# Patient Record
Sex: Male | Born: 2000
Health system: Southern US, Community
[De-identification: ages and names within clinical notes are randomized; demographics above are authoritative.]

## PROBLEM LIST (undated history)

## (undated) HISTORY — PX: OTHER SURGICAL HISTORY: SHX169

---
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
## 2020-02-18 ENCOUNTER — Emergency Department (HOSPITAL_COMMUNITY)
Admission: EM | Admit: 2020-02-18 | Discharge: 2020-02-18 | Disposition: A | Discharge status: Home / Self Care | Payer: 59 | Attending: Emergency Medicine | Admitting: Emergency Medicine

## 2020-02-18 ENCOUNTER — Encounter (HOSPITAL_COMMUNITY): Payer: Self-pay

## 2020-02-18 ENCOUNTER — Other Ambulatory Visit: Payer: Self-pay

## 2020-02-18 DIAGNOSIS — W260XXA Contact with knife, initial encounter: Secondary | ICD-10-CM | POA: Diagnosis not present

## 2020-02-18 DIAGNOSIS — S6992XA Unspecified injury of left wrist, hand and finger(s), initial encounter: Secondary | ICD-10-CM | POA: Diagnosis present

## 2020-02-18 DIAGNOSIS — S61211A Laceration without foreign body of left index finger without damage to nail, initial encounter: Secondary | ICD-10-CM | POA: Insufficient documentation

## 2020-02-18 MED ORDER — ACETAMINOPHEN 500 MG PO TABS
1000.0000 mg | ORAL_TABLET | Freq: Once | ORAL | Status: AC
  Administered 2020-02-18: 1000 mg via ORAL
  Filled 2020-02-18: qty 2

## 2020-02-18 MED ORDER — LIDOCAINE HCL (PF) 1 % IJ SOLN
5.0000 mL | Freq: Once | INTRAMUSCULAR | Status: AC
  Administered 2020-02-18: 5 mL
  Filled 2020-02-18: qty 30

## 2020-02-18 NOTE — Discharge Instructions (Addendum)
Sutured repair Keep the laceration site dry for the next 24 hours and leave the dressing in place. After 24 hours you may remove the dressing and gently clean the laceration site with antibacterial soap and warm water. Do not scrub the area. Do not soak the area and water for long periods of time. Don't use hydrogen peroxide, iodine-based solutions, or alcohol, which can slow healing, and will probably be painful! Apply topical bacitracin 1-2 times per day for the next 3-5 days. Return to the emergency department in 7-10 days for removal of the sutures.  You should return sooner for any signs of infection which would include increased redness around the wound, increased swelling, new drainage of yellow pus.     Please use Tylenol or ibuprofen for pain.  You may use 600 mg ibuprofen every 6 hours or 1000 mg of Tylenol every 6 hours.  You may choose to alternate between the 2.  This would be most effective.  Not to exceed 4 g of Tylenol within 24 hours.  Not to exceed 3200 mg ibuprofen 24 hours.   

## 2020-02-18 NOTE — ED Triage Notes (Signed)
Pt reports accidentally cut left index finger with knife.  Bleeding stopped.

## 2020-02-18 NOTE — ED Provider Notes (Signed)
[facility] EMERGENCY DEPARTMENT Provider Note   CSN: [id number] Arrival date & time: 02/18/20  0747     History Chief Complaint  Patient presents with  . Laceration    David Rocha is a 19 y.o. male.  HPI Patient is a 19 year old male presenting today with laceration that occurred approximately 7 hours ago daily radial aspect of his left index finger.  He states that he was cutting a zip tie with an X-[employer]o knife and slipped and cut his finger.  He states that the area is painful, he was able to control bleeding with direct pressure.  He denies any lightheadedness or dizziness denies any other injuries.  He states the pain is constant, worse with movement and touch.  No other associated symptoms.  No aggravating mitigating factors besides as discussed above.    History reviewed. No pertinent past medical history.  There are no problems to display for this patient.   Past Surgical History:  Procedure Laterality Date  . tubes in ears         No family history on file.  Social History   Tobacco Use  . Smoking status: Never Smoker  . Smokeless tobacco: Never Used  Substance Use Topics  . Alcohol use: No    Home Medications Prior to Admission medications   Medication Sig Start Date End Date Taking? Authorizing Provider  fluticasone (FLONASE) 50 MCG/ACT nasal spray Place 2 sprays into both nostrils daily for 10 days. 09/04/18 09/14/18  Benay Pike, NP  guaiFENesin-dextromethorphan (ROBITUSSIN DM) 100-10 MG/5ML syrup Take 5 mLs by mouth every 4 (four) hours as needed for cough. Patient not taking: Reported on 09/04/2018 01/21/18   Janalyn Harder, PA-C  ibuprofen (ADVIL,MOTRIN) 200 MG tablet Take 200 mg by mouth once as needed for pain.    [provider]  loratadine (CLARITIN) 10 MG tablet Take 10 mg by mouth daily. 04/06/18   [provider]  montelukast (SINGULAIR) 10 MG tablet Take 1 tablet (10 mg total) by mouth at bedtime. 09/04/18 10/04/18   Benay Pike, NP  mupirocin ointment (BACTROBAN) 2 % Place 1 application into the nose 2 (two) times daily. Patient not taking: Reported on 09/04/2018 01/21/18   Janalyn Harder, PA-C    Allergies    Patient has no known allergies.  Review of Systems   Review of Systems  Constitutional: Negative for chills and fever.  HENT: Negative for congestion.   Respiratory: Negative for shortness of breath.   Cardiovascular: Negative for chest pain.  Gastrointestinal: Negative for abdominal pain.  Musculoskeletal: Negative for neck pain.  Skin: Positive for wound.    Physical Exam Updated Vital Signs BP 132/76 (BP Location: Right Arm)   Pulse 69   Temp 98.7 F (37.1 C) (Oral)   Resp 18   Ht 5\' 8"  (1.727 m)   Wt 72.6 kg   SpO2 100%   BMI 24.33 kg/m   Physical Exam Vitals and nursing note reviewed.  Constitutional:      General: He is not in acute distress.    Appearance: Normal appearance. He is not ill-appearing.  HENT:     Head: Normocephalic and atraumatic.  Eyes:     General: No scleral icterus.       Right eye: No discharge.        Left eye: No discharge.     Conjunctiva/sclera: Conjunctivae normal.  Pulmonary:     Effort: Pulmonary effort is normal.     Breath sounds: No stridor.  Musculoskeletal:     Comments: Patient is able to flex and extend all joints of the left index finger.  Sensation is intact and good cap refill.  Skin:    Capillary Refill: Capillary refill takes less than 2 seconds.     Comments: Left index finger with 2 cm laceration on the radial aspect of the left index finger overlying the PIP joint that is gaping but does not involve the joint.  Neurological:     Mental Status: He is alert and oriented to person, place, and time. Mental status is at baseline.     ED Results / Procedures / Treatments   Labs (all labs ordered are listed, but only abnormal results are displayed) Labs Reviewed - No data to  display  EKG None  Radiology No results found.  Procedures .Marland KitchenLaceration Repair  Date/Time: 02/18/2020 12:08 PM Performed by: Gailen Shelter, PA Authorized by: Gailen Shelter, PA   Consent:    Consent obtained:  Verbal   Consent given by:  Patient   Risks discussed:  Infection, need for additional repair, pain, poor cosmetic result and poor wound healing   Alternatives discussed:  No treatment and delayed treatment Universal protocol:    Procedure explained and questions answered to patient or proxy's satisfaction: yes     Relevant documents present and verified: yes     Test results available: yes     Imaging studies available: yes     Required blood products, implants, devices, and special equipment available: yes     Site/side marked: yes     Immediately prior to procedure, a time out was called: yes     Patient identity confirmed:  Verbally with patient Anesthesia:    Anesthesia method:  Local infiltration Laceration details:    Location:  Finger   Finger location:  L index finger   Length (cm):  2 Exploration:    Limited defect created (wound extended): no     Hemostasis achieved with:  Direct pressure   Wound exploration: wound explored through full range of motion     Wound extent: areolar tissue violated     Wound extent: no foreign bodies/material noted and no tendon damage noted   Treatment:    Area cleansed with:  Saline   Amount of cleaning:  Extensive   Irrigation solution:  Tap water   Irrigation method:  Tap   Visualized foreign bodies/material removed: no     Debridement:  None Skin repair:    Repair method:  Sutures   Suture size:  4-0   Suture material:  Prolene   Suture technique:  Simple interrupted and horizontal mattress   Number of sutures:  2 Approximation:    Approximation:  Close Repair type:    Repair type:  Intermediate Post-procedure details:    Dressing:  Antibiotic ointment   Procedure completion:  Tolerated well, no  immediate complications   (including critical care time)  Medications Ordered in ED Medications  lidocaine (PF) (XYLOCAINE) 1 % injection 5 mL (5 mLs Infiltration Given 02/18/20 1130)  acetaminophen (TYLENOL) tablet 1,000 mg (1,000 mg Oral Given 02/18/20 1130)    ED Course  I have reviewed the triage vital signs and the nursing notes.  Pertinent labs & imaging results that were available during my care of the patient were reviewed by me and considered in my medical decision making (see chart for details).    MDM Rules/Calculators/A&P  Pressure irrigation performed. Wound explored and base of wound visualized in a bloodless field without evidence of foreign body.  Laceration occurred < 8 hours prior to repair which was well tolerated. Tdap is up-to-date.  Pt has no comorbidities to effect normal wound healing. Pt discharged without antibiotics.  Discussed suture home care with patient and answered questions. Pt to follow-up for wound check and suture removal in 7-10 days; they are to return to the ED sooner for signs of infection. Pt is hemodynamically stable with no complaints prior to dc.   1 horizontal and 1 simple interrupted stitch Good hemostasis distally neurovascularly intact after repair Return precautions given.  Final Clinical Impression(s) / ED Diagnoses Final diagnoses:  Laceration of left index finger without foreign body without damage to nail, initial encounter    Rx / DC Orders ED Discharge Orders    None       Gailen Shelter, [place] 02/18/20 1211    Vanetta Mulders, MD 02/19/20 [phone]

## 2021-02-10 ENCOUNTER — Ambulatory Visit: Admitting: Orthopedic Surgery

## 2021-02-15 ENCOUNTER — Encounter: Payer: Self-pay | Admitting: Orthopedic Surgery

## 2021-02-15 ENCOUNTER — Other Ambulatory Visit: Payer: Self-pay

## 2021-02-15 ENCOUNTER — Ambulatory Visit (INDEPENDENT_AMBULATORY_CARE_PROVIDER_SITE_OTHER): Admitting: Orthopedic Surgery

## 2021-02-15 VITALS — BP 122/69 | HR 89 | Ht 70.0 in | Wt 151.0 lb

## 2021-02-15 DIAGNOSIS — M25531 Pain in right wrist: Secondary | ICD-10-CM | POA: Diagnosis not present

## 2021-02-15 DIAGNOSIS — M25539 Pain in unspecified wrist: Secondary | ICD-10-CM

## 2021-02-15 NOTE — Progress Notes (Signed)
New Patient Visit  Assessment: David Rocha is a 20 y.o. male with the following: 1. Pain of ulnar side of wrist  Plan: The patient has pain in the ulnar side of his right wrist.  He notices pain when he is lifting certain objects.  It worsens with heavier items.  No tenderness within the TFCC.  No pain with ulnar deviation of the hand.  Pain is more in line with the ECU tendon.  We discussed multiple treatment options including medications, therapy, topical medications and a referral to a hand specialist.  He would like to see hand specialist to discuss this issues further.  Follow-up as needed.   Follow-up: Return if symptoms worsen or fail to improve.  Subjective:  Chief Complaint  Patient presents with   Wrist Pain    Bilateral R>L Pt right hand dominant    History of Present Illness: David Rocha is a 20 y.o. RHD male who has been referred to clinic today by Reatha Harps, DC for evaluation of right wrist pain.  Patient has had pain in the right wrist for a couple of years.  No specific injury.  He notes pain when lifting heavy objects.  Pain radiates up the ulnar side of the wrist, especially on the dorsum of the wrist.  He has had a similar type pain in the left wrist, but this spontaneously resolved.  He does not take medications on a consistent basis.  He has not worked with a Paramedic.  He has seen a Land, who did some adjustments to his wrist.   Review of Systems: No fevers or chills No numbness or tingling No chest pain No shortness of breath No bowel or bladder dysfunction No GI distress No headaches   Medical History:  No past medical history on file.  Past Surgical History:  Procedure Laterality Date   tubes in ears      No family history on file. Social History   Tobacco Use   Smoking status: Never   Smokeless tobacco: Never  Substance Use Topics   Alcohol use: No    No Known Allergies  Current Meds  Medication Sig   ibuprofen  (ADVIL,MOTRIN) 200 MG tablet Take 200 mg by mouth once as needed for pain.    Objective: BP 122/69    Pulse 89    Ht 5\' 10"  (1.778 m)    Wt 151 lb (68.5 kg)    BMI 21.67 kg/m   Physical Exam:  General: Alert and oriented. and No acute distress. Gait: Normal gait.  Evaluation of the right wrist demonstrates no deformity.  No swelling is appreciated.  No bruising is appreciated.  There is no point tenderness.  No pain with palpation of the TFCC directly.  No pain with ulnar deviation.  No obvious instability of the DRUJ.  No evidence of dislocating tendons on the dorsum of his wrist.  Fingers are warm and well-perfused.  Full and painless range of motion of all fingers.  Sensation is intact throughout the right hand.  IMAGING: I personally reviewed images previously obtained in clinic  Hardcopy x-rays were brought to clinic today by the patient.  There is no acute abnormalities.  Normal-appearing right wrist x-rays.  New Medications:  No orders of the defined types were placed in this encounter.     , MD  02/15/2021 11:18 PM

## 2021-02-23 ENCOUNTER — Encounter: Payer: Self-pay | Admitting: Orthopedic Surgery

## 2021-02-23 ENCOUNTER — Ambulatory Visit (INDEPENDENT_AMBULATORY_CARE_PROVIDER_SITE_OTHER)

## 2021-02-23 ENCOUNTER — Ambulatory Visit (INDEPENDENT_AMBULATORY_CARE_PROVIDER_SITE_OTHER): Admitting: Orthopedic Surgery

## 2021-02-23 ENCOUNTER — Ambulatory Visit: Payer: Self-pay

## 2021-02-23 ENCOUNTER — Other Ambulatory Visit: Payer: Self-pay

## 2021-02-23 DIAGNOSIS — M25531 Pain in right wrist: Secondary | ICD-10-CM

## 2021-02-23 DIAGNOSIS — M25532 Pain in left wrist: Secondary | ICD-10-CM | POA: Diagnosis not present
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
## 2021-03-02 ENCOUNTER — Telehealth: Payer: Self-pay | Admitting: Orthopedic Surgery

## 2021-03-02 NOTE — Telephone Encounter (Signed)
Pt requesting a call back. Pt states he was unable to pick his meds up from the pharmacy. Please send the script back to the pharmacy on file. Pt phone number is [phone].

## 2021-03-09 ENCOUNTER — Other Ambulatory Visit: Payer: Self-pay | Admitting: Orthopedic Surgery

## 2021-03-09 MED ORDER — IBUPROFEN 200 MG PO TABS: 400.0000 mg | ORAL_TABLET | Freq: Four times a day (QID) | ORAL | 0 refills | Status: AC | PRN

## 2021-03-09 NOTE — Telephone Encounter (Signed)
Patient called he would like ibuprofen (ADVIL,MOTRIN) 200 MG tablet  sent in for him. His call back number is [phone]

## 2021-03-24 ENCOUNTER — Ambulatory Visit: Admitting: Orthopedic Surgery

## 2021-03-30 ENCOUNTER — Ambulatory Visit (INDEPENDENT_AMBULATORY_CARE_PROVIDER_SITE_OTHER): Admitting: Orthopedic Surgery

## 2021-03-30 ENCOUNTER — Other Ambulatory Visit: Payer: Self-pay

## 2021-03-30 ENCOUNTER — Encounter: Payer: Self-pay | Admitting: Orthopedic Surgery

## 2021-03-30 DIAGNOSIS — M25539 Pain in unspecified wrist: Secondary | ICD-10-CM | POA: Diagnosis not present

## 2021-03-30 MED ORDER — MELOXICAM 7.5 MG PO TABS: 7.5000 mg | ORAL_TABLET | Freq: Every day | ORAL | 0 refills | Status: DC
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
## 2021-04-17 ENCOUNTER — Telehealth: Payer: Self-pay | Admitting: Orthopedic Surgery

## 2021-04-17 NOTE — Telephone Encounter (Signed)
Patient called. Asking for paper work that Dr. Tempie Donning has. His call back number is [phone]

## 2021-04-17 NOTE — Telephone Encounter (Signed)
Do you have this paperwork?

## 2021-04-18 NOTE — Telephone Encounter (Signed)
Contacted patient and made him aware that military paperwork is completed and he is able to come to the office to pick this up when he is able to do so.

## 2021-04-19 ENCOUNTER — Telehealth: Payer: Self-pay | Admitting: Orthopedic Surgery

## 2021-04-19 NOTE — Telephone Encounter (Signed)
Patient called to see if the forms was ready for pick up. He came by yesterday and the forms was not completed all the way so he left them here and was told they would be ready today. His call back number is [phone]

## 2021-04-19 NOTE — Telephone Encounter (Signed)
Contacted patient and informed him that paperwork had been completed on back side as requested. At this time we are waiting from signature from Dr. Frazier Butt. Informed patient to come into the office on 04/20/2021 at 8:00 AM to pick this up

## 2021-04-23 ENCOUNTER — Other Ambulatory Visit: Payer: Self-pay | Admitting: Orthopedic Surgery

## 2021-05-11 ENCOUNTER — Ambulatory Visit (INDEPENDENT_AMBULATORY_CARE_PROVIDER_SITE_OTHER): Admitting: Orthopedic Surgery

## 2021-05-11 ENCOUNTER — Other Ambulatory Visit: Payer: Self-pay

## 2021-05-11 ENCOUNTER — Encounter: Payer: Self-pay | Admitting: Orthopedic Surgery

## 2021-05-11 DIAGNOSIS — M25539 Pain in unspecified wrist: Secondary | ICD-10-CM

## 2021-05-11 DIAGNOSIS — M25531 Pain in right wrist: Secondary | ICD-10-CM

## 2021-05-11 NOTE — Progress Notes (Signed)
? ?  Office Visit Note ?  ?Patient: David Rocha           ?Date of Birth: 29-Mar-2000           ?MRN: [id number] ?Visit Date: 05/11/2021 ?             ?Requested by: No referring provider defined for this encounter. ?PCP: Patient, No Pcp Per (Inactive) ? ? ?Assessment & Plan: ?Visit Diagnoses:  ?1. Pain of ulnar side of wrist   ? ? ?Plan: Patient's ulnar sided wrist pain is largely unchanged from previous despite oral NSAIDs use, activity modification, and immobilization.  His exam is a little different today with a negative ECU synergy test.  He still has no DRUJ instability or pain w/ ulnocarpal loading suggestive of a TFCC tear.  Because he has failed conservative management so far, will proceed with an MRI of the wrist to further evaluate.  ? ?Follow-Up Instructions: No follow-ups on file.  ? ?Orders:  ?No orders of the defined types were placed in this encounter. ? ?No orders of the defined types were placed in this encounter. ? ? ? ? Procedures: ?No procedures performed ? ? ?Clinical Data: ?No additional findings. ? ? ?Subjective: ?Chief Complaint  ?Patient presents with  ? Right Wrist - Follow-up  ? ? ?This is a 21 year old right-hand-dominant male who presents with continued right ulnar-sided wrist pain.  He has been wearing a removable wrist brace and has been taking oral meloxicam.  He still has ulnar sided wrist discomfort at work when lifting any sized object and with rotational activities.  ? ? ?Review of Systems ? ? ?Objective: ?Vital Signs: There were no vitals taken for this visit. ? ?Physical Exam ? ?Right Hand Exam  ? ?Tenderness  ?Right hand tenderness location: No foveal tenderness.  No TTP along ECU tendon. ? ?Range of Motion  ?The patient has normal right wrist ROM.  ? ?Other  ?Erythema: absent ?Sensation: normal ?Pulse: present ? ?Comments:  No pain w/ ulnocarpal loading.  No DRUJ instability.  Negative ECU synergy test.  No ECU instability.  ? ? ? ? ?Specialty Comments:  ?No specialty comments  available. ? ?Imaging: ?No results found. ? ? ?PMFS History: ?Patient Active Problem List  ? Diagnosis Date Noted  ? Pain of ulnar side of wrist 03/30/2021  ? ?History reviewed. No pertinent past medical history.  ?History reviewed. No pertinent family history.  ?Past Surgical History:  ?Procedure Laterality Date  ? tubes in ears    ? ?Social History  ? ?Occupational History  ? Not on file  ?Tobacco Use  ? Smoking status: Never  ? Smokeless tobacco: Never  ?Substance and Sexual Activity  ? Alcohol use: No  ? Drug use: Not on file  ? Sexual activity: Not on file  ? ? ? ? ? ? ?

## 2021-05-12 ENCOUNTER — Telehealth: Payer: Self-pay

## 2021-05-12 NOTE — Telephone Encounter (Signed)
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 ?

## 2021-05-24 ENCOUNTER — Telehealth: Payer: Self-pay | Admitting: Orthopedic Surgery

## 2021-05-24 ENCOUNTER — Ambulatory Visit
Admission: RE | Admit: 2021-05-24 | Discharge: 2021-05-24 | Disposition: A | Discharge status: Home / Self Care | Source: Ambulatory Visit | Attending: Orthopedic Surgery | Admitting: Orthopedic Surgery

## 2021-05-24 DIAGNOSIS — M25539 Pain in unspecified wrist: Secondary | ICD-10-CM

## 2021-05-24 NOTE — Telephone Encounter (Signed)
Dr. Frazier Butt has it to complete--- ?

## 2021-05-24 NOTE — Telephone Encounter (Signed)
Patient calling to check on a military form he sent in a week or so ago to be filled out. Please call [phone] ?

## 2021-05-25 NOTE — Telephone Encounter (Signed)
Patient has been contacted and informed that forms have been filled out and is ready for pick up. ?

## 2021-05-30 ENCOUNTER — Encounter: Payer: Self-pay | Admitting: Orthopedic Surgery

## 2021-05-30 ENCOUNTER — Ambulatory Visit (INDEPENDENT_AMBULATORY_CARE_PROVIDER_SITE_OTHER): Payer: Self-pay | Admitting: Orthopedic Surgery

## 2021-05-30 ENCOUNTER — Other Ambulatory Visit: Payer: Self-pay

## 2021-05-30 DIAGNOSIS — M25539 Pain in unspecified wrist: Secondary | ICD-10-CM

## 2021-05-30 DIAGNOSIS — M25531 Pain in right wrist: Secondary | ICD-10-CM

## 2021-05-30 NOTE — Progress Notes (Signed)
? ?Office Visit Note ?  ?Patient: David Rocha           ?Date of Birth: 11-04-2000           ?MRN: [id number] ?Visit Date: 05/30/2021 ?             ?Requested by: No referring provider defined for this encounter. ?PCP: Patient, No Pcp Per (Inactive) ? ? ?Assessment & Plan: ?Visit Diagnoses:  ?1. Pain of ulnar side of wrist   ? ? ?Plan: We reviewed the results of his MRI which were essentially normal.  He has small DRUJ effusion but his TFCC and radioulnar ligaments were intact.  He had no fluid around the ECU tendon suggestive of ECU tendinitis.  His exam today is again unremarkable with no DRUJ instability, no pain or instability with ECU testing, and no evidence of TFCC injury.  We discussed trying hand therapy to work on global hand and wrist strengthening and conditioning versus potentially getting a second opinion with another [employer]or in town.  He would like to get a second opinion first before he tries therapy. ? ?Follow-Up Instructions: No follow-ups on file.  ? ?Orders:  ?No orders of the defined types were placed in this encounter. ? ?No orders of the defined types were placed in this encounter. ? ? ? ? Procedures: ?No procedures performed ? ? ?Clinical Data: ?No additional findings. ? ? ?Subjective: ?Chief Complaint  ?Patient presents with  ? Right Wrist - Follow-up  ? ? ?This is a 21 year old right-hand-dominant male who presents for follow-up of right ulnar-sided wrist pain.  He was last seen a few weeks ago at which point the plan was to get an MRI to further evaluate his wrist.  His MRI was largely unremarkable with no structural injuries noted.  He has tried a removable wrist brace and oral anti-inflammatory medication.  His pain is again worse with certain activities that involve weightbearing or hyperextension of the wrist such as a push-up.  He still describes difficulty at work with lifting and rotational activities.  ? ? ?Review of Systems ? ? ?Objective: ?Vital Signs: There were no vitals  taken for this visit. ? ?Physical Exam ? ?Right Hand Exam  ? ?Tenderness  ?The patient is experiencing no tenderness.  ? ?Range of Motion  ?Wrist  ?Right wrist extension: 65.  ?Right wrist flexion: 50.  ? ?Muscle Strength  ?The patient has normal right wrist strength. ? ?Other  ?Erythema: absent ?Sensation: normal ?Pulse: present ? ?Comments:  No TTP at fovea and no pain w/ ulnocarpal loading.  No DRUJ instability.  No pain w/ compression at the DRUJ.  Negative ECU synergy test and no ECU instability with ROM.  Full and painless active WE and WF. Full and painless pronation/supination.  ? ? ? ? ?Specialty Comments:  ?No specialty comments available. ? ?Imaging: ?No results found. ? ? ?PMFS History: ?Patient Active Problem List  ? Diagnosis Date Noted  ? Pain of ulnar side of wrist 03/30/2021  ? ?History reviewed. No pertinent past medical history.  ?History reviewed. No pertinent family history.  ?Past Surgical History:  ?Procedure Laterality Date  ? tubes in ears    ? ?Social History  ? ?Occupational History  ? Not on file  ?Tobacco Use  ? Smoking status: Never  ? Smokeless tobacco: Never  ?Substance and Sexual Activity  ? Alcohol use: No  ? Drug use: Not on file  ? Sexual activity: Not on file  ? ? ? ? ? ? ?

## 2021-06-19 ENCOUNTER — Telehealth: Payer: Self-pay | Admitting: Orthopedic Surgery

## 2021-06-19 NOTE — Telephone Encounter (Signed)
Pt is requesting copy of right wrist MRI  on a disk  ?

## 2021-06-20 NOTE — Telephone Encounter (Signed)
I called and advised patient that he would have to get the MRI from the facility that it was done, that we can only see it. He states that he understands and will call them ?

## 2022-05-03 ENCOUNTER — Encounter: Payer: Self-pay | Admitting: Radiology

## 2023-04-26 ENCOUNTER — Ambulatory Visit
Admission: EM | Admit: 2023-04-26 | Discharge: 2023-04-26 | Disposition: A | Discharge status: Home / Self Care | Payer: Self-pay | Attending: Family Medicine | Admitting: Family Medicine

## 2023-04-26 ENCOUNTER — Encounter: Payer: Self-pay | Admitting: Emergency Medicine

## 2023-04-26 DIAGNOSIS — J101 Influenza due to other identified influenza virus with other respiratory manifestations: Secondary | ICD-10-CM

## 2023-04-26 LAB — POC COVID19/FLU A&B COMBO
Covid Antigen, POC: NEGATIVE
Influenza A Antigen, POC: POSITIVE — AB
Influenza B Antigen, POC: NEGATIVE

## 2023-04-26 MED ORDER — OSELTAMIVIR PHOSPHATE 75 MG PO CAPS: 75.0000 mg | ORAL_CAPSULE | Freq: Two times a day (BID) | ORAL | 0 refills | Status: AC

## 2023-04-26 MED ORDER — PROMETHAZINE-DM 6.25-15 MG/5ML PO SYRP: 5.0000 mL | ORAL_SOLUTION | Freq: Four times a day (QID) | ORAL | 0 refills | Status: AC | PRN

## 2023-04-26 NOTE — ED Triage Notes (Signed)
Fever, head congestion, productive cough, headache x 2 days.

## 2023-04-26 NOTE — ED Provider Notes (Signed)
[facility]    CSN: [id number] Arrival date & time: 04/26/23  4098      History   Chief Complaint No chief complaint on file.   HPI David Rocha is a 23 y.o. male.   Presenting today with 2-day history of fever, congestion, cough, headache, body aches, fatigue.  Denies chest pain, shortness of breath, abdominal pain, vomiting, diarrhea.  So far trying DayQuil, NyQuil, ibuprofen and Tylenol as needed.  Multiple sick contacts recently.    History reviewed. No pertinent past medical history.  Patient Active Problem List   Diagnosis Date Noted   Pain of ulnar side of wrist 03/30/2021    Past Surgical History:  Procedure Laterality Date   tubes in ears         Home Medications    Prior to Admission medications   Medication Sig Start Date End Date Taking? Authorizing Provider  oseltamivir (TAMIFLU) 75 MG capsule Take 1 capsule (75 mg total) by mouth every 12 (twelve) hours. 04/26/23  Yes Particia Nearing, PA-C  promethazine-dextromethorphan (PROMETHAZINE-DM) 6.25-15 MG/5ML syrup Take 5 mLs by mouth 4 (four) times daily as needed. 04/26/23  Yes Particia Nearing, PA-C    Family History History reviewed. No pertinent family history.  Social History Social History   Tobacco Use   Smoking status: Never   Smokeless tobacco: Never  Vaping Use   Vaping status: Never Used  Substance Use Topics   Alcohol use: No   Drug use: Never     Allergies   Patient has no known allergies.   Review of Systems Review of Systems PER HPI  Physical Exam Triage Vital Signs ED Triage Vitals  Encounter Vitals Group     BP 04/26/23 0822 129/70     Systolic BP Percentile --      Diastolic BP Percentile --      Pulse Rate 04/26/23 0822 97     Resp 04/26/23 0822 18     Temp 04/26/23 0822 98.7 F (37.1 C)     Temp Source 04/26/23 0822 Oral     SpO2 04/26/23 0822 94 %     Weight --      Height --      Head Circumference --      Peak Flow --      Pain  Score 04/26/23 0823 6     Pain Loc --      Pain Education --      Exclude from Growth Chart --    No data found.  Updated Vital Signs BP 129/70 (BP Location: Right Arm)   Pulse 97   Temp 98.7 F (37.1 C) (Oral)   Resp 18   SpO2 94%   Visual Acuity Right Eye Distance:   Left Eye Distance:   Bilateral Distance:    Right Eye Near:   Left Eye Near:    Bilateral Near:     Physical Exam Vitals and nursing note reviewed.  Constitutional:      Appearance: He is well-developed.  HENT:     Head: Atraumatic.     Right Ear: External ear normal.     Left Ear: External ear normal.     Nose: Rhinorrhea present.     Mouth/Throat:     Mouth: Mucous membranes are moist.     Pharynx: Oropharynx is clear. Posterior oropharyngeal erythema present. No oropharyngeal exudate.  Eyes:     Conjunctiva/sclera: Conjunctivae normal.     Pupils: Pupils are equal, round, and  reactive to light.  Cardiovascular:     Rate and Rhythm: Normal rate and regular rhythm.  Pulmonary:     Effort: Pulmonary effort is normal. No respiratory distress.     Breath sounds: No wheezing or rales.  Musculoskeletal:        General: Normal range of motion.     Cervical back: Normal range of motion and neck supple.  Lymphadenopathy:     Cervical: No cervical adenopathy.  Skin:    General: Skin is warm and dry.  Neurological:     Mental Status: He is alert and oriented to person, place, and time.  Psychiatric:        Behavior: Behavior normal.      UC Treatments / Results  Labs (all labs ordered are listed, but only abnormal results are displayed) Labs Reviewed  POC COVID19/FLU A&B COMBO - Abnormal; Notable for the following components:      Result Value   Influenza A Antigen, POC Positive (*)    All other components within normal limits    EKG   Radiology No results found.  Procedures Procedures (including critical care time)  Medications Ordered in UC Medications - No data to  display  Initial Impression / Assessment and Plan / UC Course  I have reviewed the triage vital signs and the nursing notes.  Pertinent labs & imaging results that were available during my care of the patient were reviewed by me and considered in my medical decision making (see chart for details).     Rapid flu positive today, vitals and exam reassuring.  Will treat with Tamiflu, Phenergan DM, supportive over-the-counter medications and home care.  Work note given.  Return for worsening symptoms.  Final Clinical Impressions(s) / UC Diagnoses   Final diagnoses:  Influenza A   Discharge Instructions   None    ED Prescriptions     Medication Sig Dispense Auth. Provider   oseltamivir (TAMIFLU) 75 MG capsule Take 1 capsule (75 mg total) by mouth every 12 (twelve) hours. 10 capsule Particia Nearing, [place]   promethazine-dextromethorphan (PROMETHAZINE-DM) 6.25-15 MG/5ML syrup Take 5 mLs by mouth 4 (four) times daily as needed. 100 mL Particia Nearing, [place]      PDMP not reviewed this encounter.   Roosvelt Maser [employer], [place] 04/26/23 [phone]
